# Patient Record
Sex: Male | Born: 1995 | Race: White | Hispanic: No | Marital: Single | Smoking: Never smoker
Health system: Southern US, Community
[De-identification: ages and names within clinical notes are randomized; demographics above are authoritative.]

## PROBLEM LIST (undated history)

## (undated) HISTORY — PX: HERNIA REPAIR: SHX51

---

## 2014-01-10 ENCOUNTER — Emergency Department: Payer: Self-pay | Admitting: Emergency Medicine

## 2015-11-16 ENCOUNTER — Ambulatory Visit
Admission: RE | Admit: 2015-11-16 | Discharge: 2015-11-16 | Disposition: A | Discharge status: Home / Self Care | Payer: Self-pay | Source: Ambulatory Visit | Attending: Family Medicine | Admitting: Family Medicine

## 2015-11-16 ENCOUNTER — Other Ambulatory Visit: Payer: Self-pay | Admitting: Family Medicine

## 2015-11-16 DIAGNOSIS — M79645 Pain in left finger(s): Secondary | ICD-10-CM | POA: Insufficient documentation

## 2015-11-16 DIAGNOSIS — R52 Pain, unspecified: Secondary | ICD-10-CM

## 2015-11-16 DIAGNOSIS — R609 Edema, unspecified: Secondary | ICD-10-CM

## 2015-11-16 DIAGNOSIS — M25442 Effusion, left hand: Secondary | ICD-10-CM | POA: Insufficient documentation

## 2016-01-30 ENCOUNTER — Emergency Department: Payer: PRIVATE HEALTH INSURANCE

## 2016-01-30 ENCOUNTER — Emergency Department
Admission: EM | Admit: 2016-01-30 | Discharge: 2016-01-30 | Disposition: A | Discharge status: Home / Self Care | Payer: PRIVATE HEALTH INSURANCE | Attending: Emergency Medicine | Admitting: Emergency Medicine

## 2016-01-30 DIAGNOSIS — Y999 Unspecified external cause status: Secondary | ICD-10-CM | POA: Insufficient documentation

## 2016-01-30 DIAGNOSIS — Z8739 Personal history of other diseases of the musculoskeletal system and connective tissue: Secondary | ICD-10-CM

## 2016-01-30 DIAGNOSIS — Z87828 Personal history of other (healed) physical injury and trauma: Secondary | ICD-10-CM | POA: Insufficient documentation

## 2016-01-30 DIAGNOSIS — X58XXXA Exposure to other specified factors, initial encounter: Secondary | ICD-10-CM | POA: Insufficient documentation

## 2016-01-30 DIAGNOSIS — Y929 Unspecified place or not applicable: Secondary | ICD-10-CM | POA: Insufficient documentation

## 2016-01-30 DIAGNOSIS — Y9318 Activity, surfing, windsurfing and boogie boarding: Secondary | ICD-10-CM | POA: Insufficient documentation

## 2016-01-30 MED ORDER — MELOXICAM 15 MG PO TABS: 15.0000 mg | ORAL_TABLET | Freq: Every day | ORAL | Status: AC

## 2016-01-30 MED ORDER — CYCLOBENZAPRINE HCL 10 MG PO TABS: 10.0000 mg | ORAL_TABLET | Freq: Three times a day (TID) | ORAL | Status: AC | PRN

## 2016-01-30 NOTE — ED Notes (Signed)
Pt reports to ED w/ c/o R shoulder injury.  pt sts that he dislocated same shoulder Dec 2016.  Pt sts that he was [place]ite surfing [place] ocean at time of injury.  AND. Sensation and pulses intact.

## 2016-01-30 NOTE — ED Provider Notes (Signed)
[facility] Emergency Department Provider Note  ____________________________________________  Time seen: Approximately 9:56 PM  I have reviewed the triage vital signs and the nursing notes.   HISTORY  Chief Complaint Shoulder Injury    HPI Bradley Sparks is a 20 y.o. male complaining of shoulder pain after an accident kite surfing this afternoon. Patient states he has dislocated his shoulder before and his very familiar with the presentation. He states he is confident it was dislocated today but that they easily popped it back in place. He notes he is hoping for an arm sling and something for the pain. Denies head trauma, numbness or tingling in the affected extremity. he reports full range of motion at this time but does report some tenderness to the area. She denies any other injury or complaint at this time.   History reviewed. No pertinent past medical history.  There are no active problems to display for this patient.   Past Surgical History  Procedure Laterality Date  . Hernia repair      Current Outpatient Rx  Name  Route  Sig  Dispense  Refill  . cyclobenzaprine (FLEXERIL) 10 MG tablet   Oral   Take 1 tablet (10 mg total) by mouth 3 (three) times daily as needed for muscle spasms.   15 tablet   0   . meloxicam (MOBIC) 15 MG tablet   Oral   Take 1 tablet (15 mg total) by mouth daily.   30 tablet   0     Allergies Review of patient's allergies indicates no known allergies.  No family history on file.  Social History Social History  Substance Use Topics  . Smoking status: Never Smoker   . Smokeless tobacco: None  . Alcohol Use: None     Review of Systems  Eyes: No visual changes.  ENT: No upper respiratory complaints. Cardiovascular: no chest pain. Musculoskeletal:  Positive for pain in the right shoulder Skin: Negative for rash, abrasions, lacerations, ecchymosis. Neurological: Negative for headaches, focal weakness or  numbness. 10-point ROS otherwise negative.  ____________________________________________   PHYSICAL EXAM:  VITAL SIGNS: ED Triage Vitals  Enc Vitals Group     BP 01/30/16 2057 135/79 mmHg     Pulse Rate 01/30/16 2057 100     Resp 01/30/16 2057 18     Temp 01/30/16 2057 98 F (36.7 C)     Temp Source 01/30/16 2057 Oral     SpO2 01/30/16 2057 97 %     Weight 01/30/16 2057 142 lb (64.411 kg)     Height 01/30/16 2057  (1.676 m)     Head Cir --      Peak Flow --      Pain Score 01/30/16 2108 3     Pain Loc --      Pain Edu? --      Excl. in GC? --      Constitutional: Alert and oriented. Well appearing and in no acute distress. Head: Atraumatic. Neck: No stridor.   Respiratory: Normal respiratory effort without tachypnea or retractions.  Musculoskeletal: Full range of motion to all extremities. No gross deformities appreciated. Limited ROM of the right shoulder due to pain. No visible deformity. Patient is moderately tender to palpation over the anterior aspect. No palpable osseous abnormality. Radial pulses appreciated distally. Sensation intact distally. Neurologic:  Normal speech and language. No gross focal neurologic deficits are appreciated.  Skin:  Skin is warm, dry and intact. No rash noted. Psychiatric: Mood  and affect are normal. Speech and behavior are normal. Patient exhibits appropriate insight and judgement.   ____________________________________________   LABS (all labs ordered are listed, but only abnormal results are displayed)  Labs Reviewed - No data to display ____________________________________________  EKG   ____________________________________________  RADIOLOGY Festus BarrenI, Herminio Kniskern D Pepper Kerrick, personally viewed and evaluated these images (plain radiographs) as part of my medical decision making, as well as reviewing the written report by the radiologist.  Dg Shoulder Right  01/30/2016  CLINICAL DATA:  Harrie ForemanKite surfing injury today. EXAM: RIGHT  SHOULDER - 2+ VIEW COMPARISON:  None. FINDINGS: There is no evidence of fracture or dislocation. There is no evidence of arthropathy or other focal bone abnormality. Soft tissues are unremarkable. IMPRESSION: Negative. Electronically Signed   By: Ellery Plunkaniel R Mitchell M.D.   On: 01/30/2016 21:26    ____________________________________________    PROCEDURES  Procedure(s) performed:       Medications - No data to display   ____________________________________________   INITIAL IMPRESSION / ASSESSMENT AND PLAN / ED COURSE  Pertinent labs & imaging results that were available during my care of the patient were reviewed by me and considered in my medical decision making (see chart for details).  Patient's diagnosis is consistent with shoulder dislocation to the right shoulder. Patient reduced shoulder prior to arrival. X-ray reveals no osseous abnormality to include fractures or dislocations. Patient is given a sling. Emergency department. Patient will be discharged home with prescriptions for anti-inflammatories and muscle relaxers. Patient is receiving an orthopedic surgeon for this complaint and is scheduled to have surgery during the summer for this condition..  Patient is given ED precautions to return to the ED for any worsening or new symptoms.     ____________________________________________  FINAL CLINICAL IMPRESSION(S) / ED DIAGNOSES  Final diagnoses:  History of dislocation of shoulder      NEW MEDICATIONS STARTED DURING THIS VISIT:  New Prescriptions   CYCLOBENZAPRINE (FLEXERIL) 10 MG TABLET    Take 1 tablet (10 mg total) by mouth 3 (three) times daily as needed for muscle spasms.   MELOXICAM (MOBIC) 15 MG TABLET    Take 1 tablet (15 mg total) by mouth daily.        This chart was dictated using voice recognition software/Dragon. Despite best efforts to proofread, errors can occur which can change the meaning. Any change was purely unintentional.   Racheal PatchesJonathan D  Nico Syme, PA-C 01/30/16 2216  Delorise RoyalsJonathan D Jarika Robben, PA-C 01/30/16 2222  Emily FilbertJonathan E Williams, MD 01/30/16 [phone]

## 2017-07-20 IMAGING — CR DG FINGER RING 2+V*L*
1 series · 3 of 3 positions shown · non-contrast
Comparison: None.

CLINICAL DATA: Football injury with fourth digit pain, initial
encounter

EXAM:
LEFT RING FINGER 2+V

[Series 1: x finger pa left · 0.14mm/px · 3 of 3 slices shown]
[im 1/3]
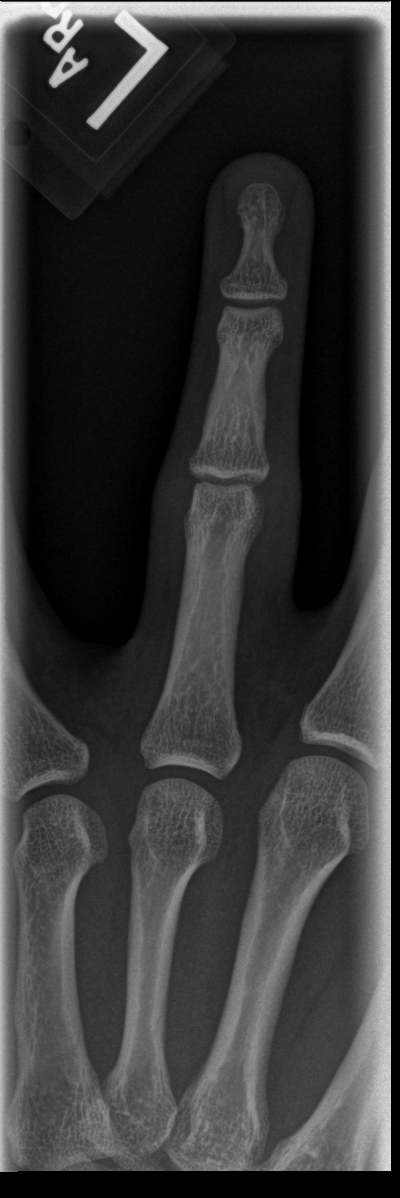
[im 2/3]
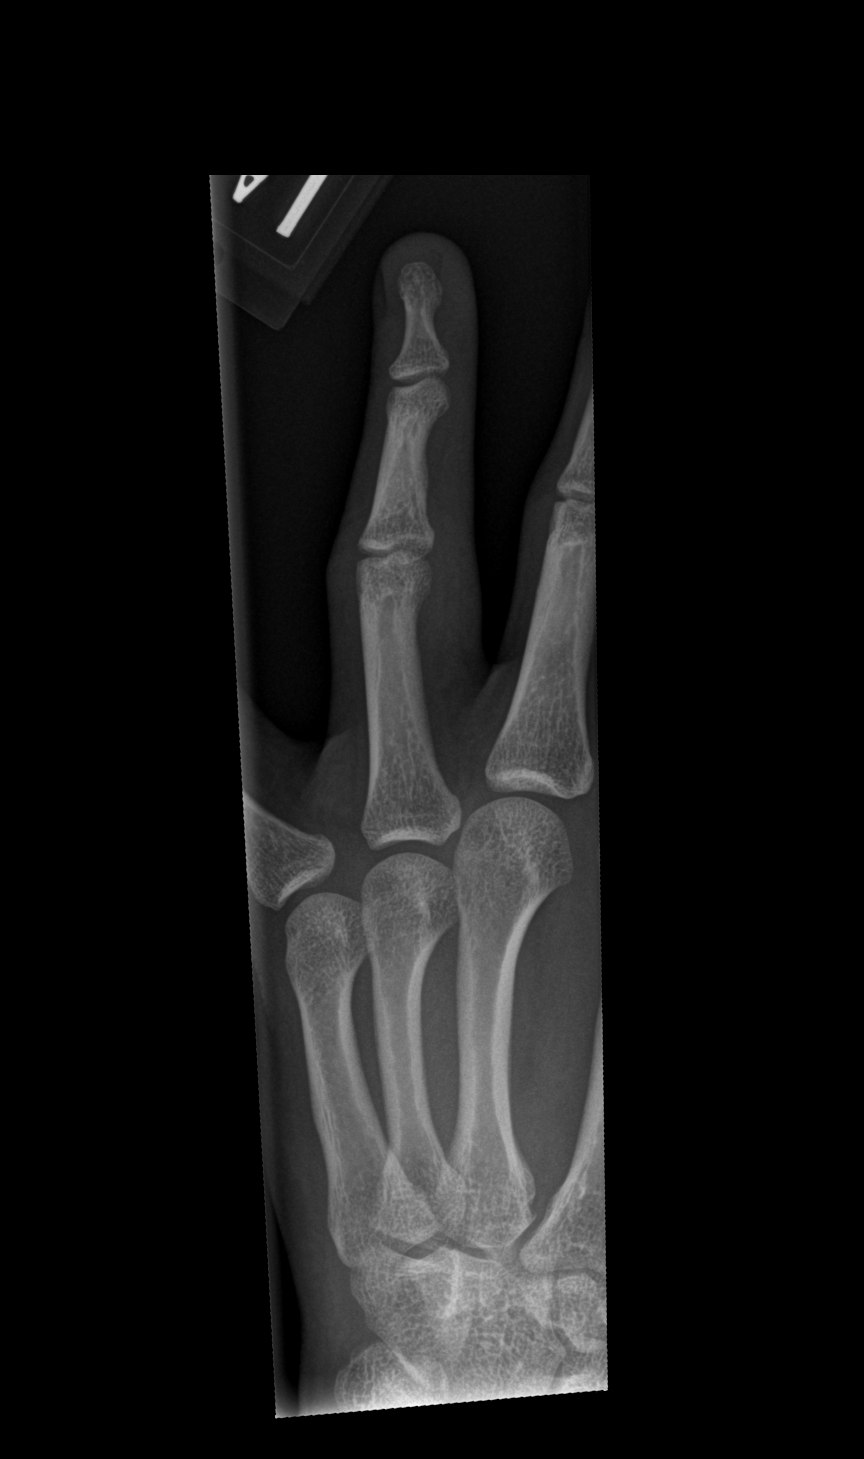
[im 3/3]
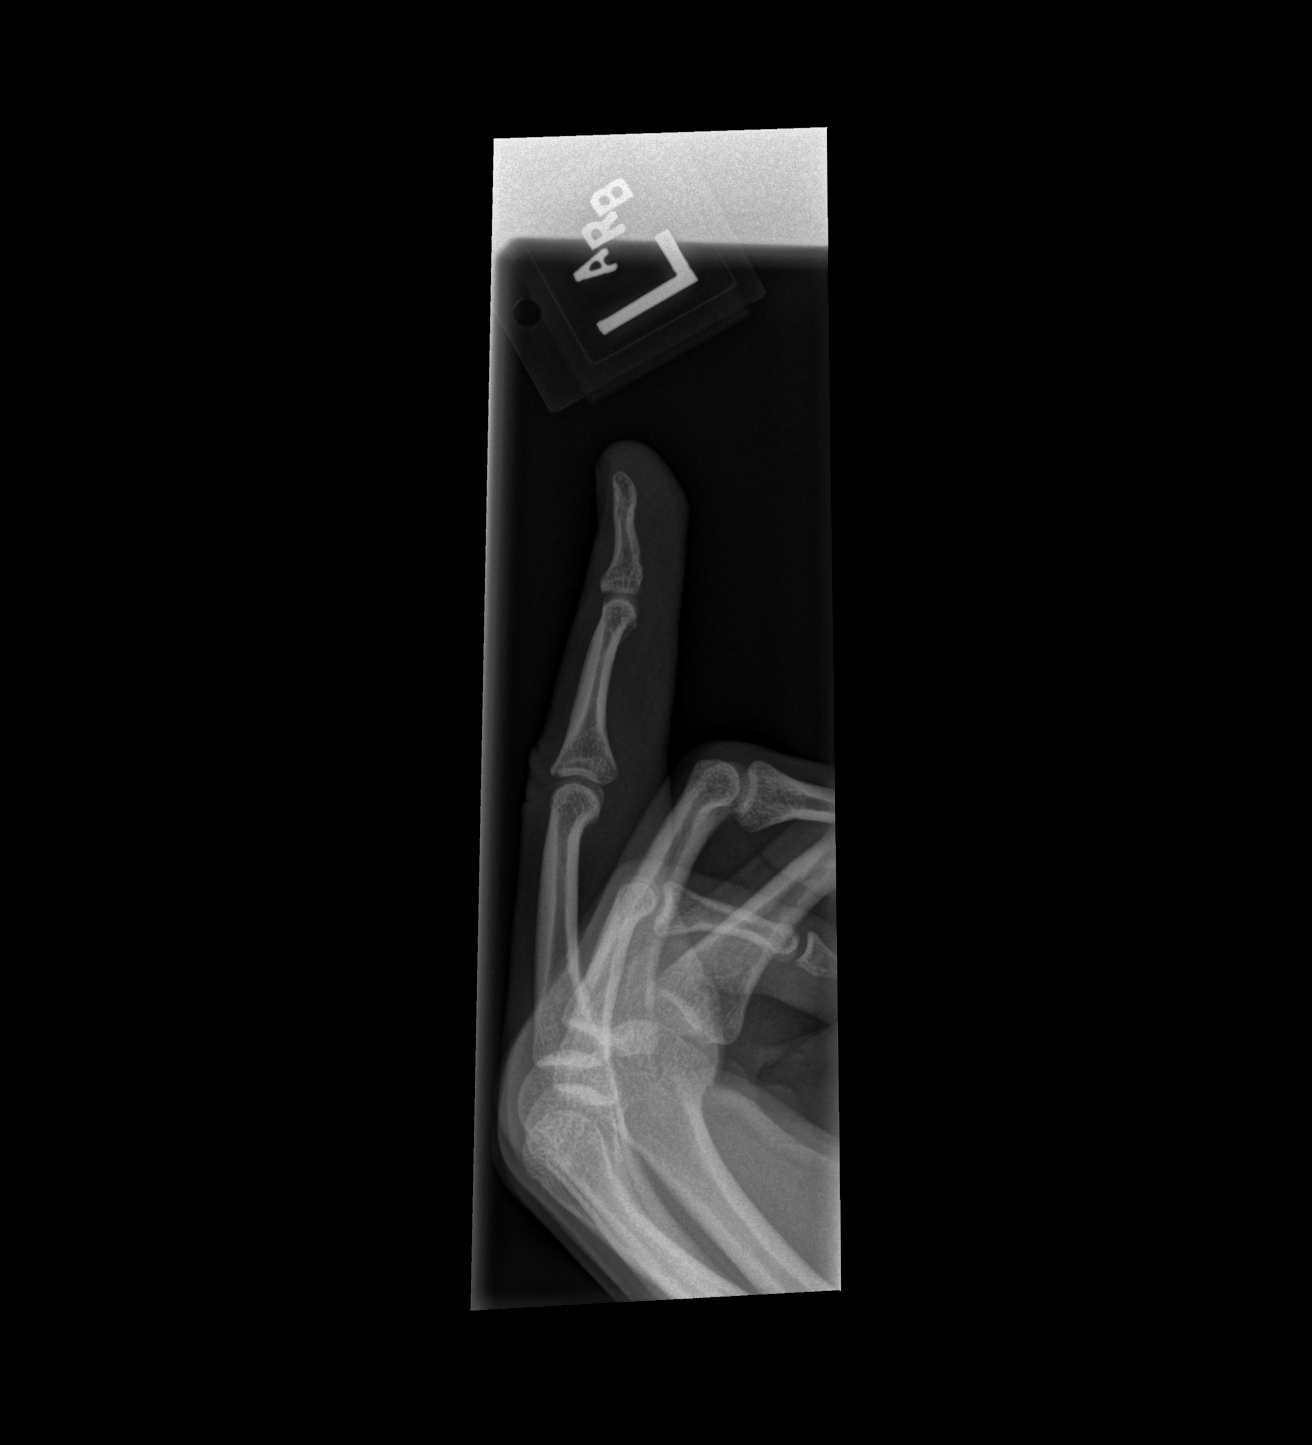

[3 of 3 positions shown; findings below may reference images not displayed]

FINDINGS: Mild soft tissue swelling is noted about the PIP joint. No acute
fracture or dislocation is noted. No other focal abnormality is
noted.
IMPRESSION: Mild swelling at the PIP joint.  No acute abnormality noted.
